# Patient Record
Sex: Male | Born: 1965 | Race: White | Hispanic: No | Marital: Married | Smoking: Former smoker
Health system: Southern US, Community
[De-identification: ages and names within clinical notes are randomized; demographics above are authoritative.]

## PROBLEM LIST (undated history)

## (undated) ENCOUNTER — Emergency Department (HOSPITAL_BASED_OUTPATIENT_CLINIC_OR_DEPARTMENT_OTHER): Admission: EM | Payer: Self-pay | Source: Home / Self Care

## (undated) DIAGNOSIS — K760 Fatty (change of) liver, not elsewhere classified: Secondary | ICD-10-CM

## (undated) DIAGNOSIS — K859 Acute pancreatitis without necrosis or infection, unspecified: Secondary | ICD-10-CM

## (undated) DIAGNOSIS — I10 Essential (primary) hypertension: Secondary | ICD-10-CM

---

## 2010-11-06 ENCOUNTER — Emergency Department (HOSPITAL_BASED_OUTPATIENT_CLINIC_OR_DEPARTMENT_OTHER): Payer: 59

## 2010-11-06 ENCOUNTER — Emergency Department (HOSPITAL_BASED_OUTPATIENT_CLINIC_OR_DEPARTMENT_OTHER)
Admission: EM | Admit: 2010-11-06 | Discharge: 2010-11-06 | Disposition: A | Discharge status: Nursing Home (Includes ICF) | Payer: 59 | Attending: Emergency Medicine | Admitting: Emergency Medicine

## 2010-11-06 ENCOUNTER — Inpatient Hospital Stay (HOSPITAL_COMMUNITY)
Admission: RE | Admit: 2010-11-06 | Discharge: 2010-11-10 | DRG: 440 | Disposition: A | Discharge status: Home / Self Care | Payer: 59 | Source: Other Acute Inpatient Hospital | Attending: Family Medicine | Admitting: Family Medicine

## 2010-11-06 ENCOUNTER — Emergency Department (INDEPENDENT_AMBULATORY_CARE_PROVIDER_SITE_OTHER): Payer: 59

## 2010-11-06 DIAGNOSIS — I10 Essential (primary) hypertension: Secondary | ICD-10-CM | POA: Diagnosis present

## 2010-11-06 DIAGNOSIS — K859 Acute pancreatitis without necrosis or infection, unspecified: Principal | ICD-10-CM | POA: Diagnosis present

## 2010-11-06 DIAGNOSIS — K7689 Other specified diseases of liver: Secondary | ICD-10-CM | POA: Diagnosis present

## 2010-11-06 DIAGNOSIS — I959 Hypotension, unspecified: Secondary | ICD-10-CM | POA: Diagnosis present

## 2010-11-06 DIAGNOSIS — R7989 Other specified abnormal findings of blood chemistry: Secondary | ICD-10-CM | POA: Diagnosis present

## 2010-11-06 DIAGNOSIS — Z23 Encounter for immunization: Secondary | ICD-10-CM

## 2010-11-06 DIAGNOSIS — R109 Unspecified abdominal pain: Secondary | ICD-10-CM

## 2010-11-06 DIAGNOSIS — D72829 Elevated white blood cell count, unspecified: Secondary | ICD-10-CM | POA: Diagnosis present

## 2010-11-06 DIAGNOSIS — K8689 Other specified diseases of pancreas: Secondary | ICD-10-CM

## 2010-11-06 DIAGNOSIS — F1010 Alcohol abuse, uncomplicated: Secondary | ICD-10-CM | POA: Diagnosis present

## 2010-11-06 DIAGNOSIS — R079 Chest pain, unspecified: Secondary | ICD-10-CM | POA: Diagnosis present

## 2010-11-06 DIAGNOSIS — W010XXA Fall on same level from slipping, tripping and stumbling without subsequent striking against object, initial encounter: Secondary | ICD-10-CM | POA: Diagnosis present

## 2010-11-06 LAB — URINALYSIS, ROUTINE W REFLEX MICROSCOPIC
Glucose, UA: NEGATIVE mg/dL
Ketones, ur: 40 mg/dL — AB
Nitrite: NEGATIVE
Specific Gravity, Urine: 1.019 (ref 1.005–1.030)
pH: 7 (ref 5.0–8.0)

## 2010-11-06 LAB — CBC
MCHC: 34.6 g/dL (ref 30.0–36.0)
MCV: 87.4 fL (ref 78.0–100.0)
Platelets: 226 10*3/uL (ref 150–400)
RDW: 12.9 % (ref 11.5–15.5)
WBC: 14.7 10*3/uL — ABNORMAL HIGH (ref 4.0–10.5)

## 2010-11-06 LAB — COMPREHENSIVE METABOLIC PANEL
Albumin: 4.8 g/dL (ref 3.5–5.2)
Alkaline Phosphatase: 80 U/L (ref 39–117)
BUN: 11 mg/dL (ref 6–23)
Calcium: 9.4 mg/dL (ref 8.4–10.5)
Potassium: 4.7 mEq/L (ref 3.5–5.1)
Total Protein: 8.5 g/dL — ABNORMAL HIGH (ref 6.0–8.3)

## 2010-11-06 LAB — DIFFERENTIAL
Basophils Absolute: 0 10*3/uL (ref 0.0–0.1)
Eosinophils Absolute: 0.2 10*3/uL (ref 0.0–0.7)
Eosinophils Relative: 1 % (ref 0–5)
Lymphs Abs: 1.8 10*3/uL (ref 0.7–4.0)
Monocytes Absolute: 1.7 10*3/uL — ABNORMAL HIGH (ref 0.1–1.0)

## 2010-11-06 MED ORDER — IOHEXOL 300 MG/ML  SOLN
100.0000 mL | Freq: Once | INTRAMUSCULAR | Status: AC | PRN
  Administered 2010-11-06: 100 mL via INTRAVENOUS

## 2010-11-07 ENCOUNTER — Inpatient Hospital Stay (HOSPITAL_COMMUNITY): Payer: 59

## 2010-11-07 LAB — CBC
MCHC: 33.3 g/dL (ref 30.0–36.0)
Platelets: 209 10*3/uL (ref 150–400)
RDW: 12.9 % (ref 11.5–15.5)
WBC: 14.5 10*3/uL — ABNORMAL HIGH (ref 4.0–10.5)

## 2010-11-07 LAB — HEPATIC FUNCTION PANEL
ALT: 47 U/L (ref 0–53)
Albumin: 3.2 g/dL — ABNORMAL LOW (ref 3.5–5.2)
Alkaline Phosphatase: 54 U/L (ref 39–117)
Indirect Bilirubin: 1.3 mg/dL — ABNORMAL HIGH (ref 0.3–0.9)
Total Bilirubin: 1.7 mg/dL — ABNORMAL HIGH (ref 0.3–1.2)
Total Protein: 6.6 g/dL (ref 6.0–8.3)

## 2010-11-07 LAB — CARDIAC PANEL(CRET KIN+CKTOT+MB+TROPI)
Relative Index: INVALID (ref 0.0–2.5)
Troponin I: 0.01 ng/mL (ref 0.00–0.06)

## 2010-11-07 LAB — LIPID PANEL
HDL: 39 mg/dL — ABNORMAL LOW (ref >39)
Triglycerides: 63 mg/dL (ref <150)
VLDL: 13 mg/dL (ref 0–40)

## 2010-11-07 LAB — GLUCOSE, CAPILLARY
Glucose-Capillary: 104 mg/dL — ABNORMAL HIGH (ref 70–99)
Glucose-Capillary: 80 mg/dL (ref 70–99)
Glucose-Capillary: 92 mg/dL (ref 70–99)
Glucose-Capillary: 94 mg/dL (ref 70–99)

## 2010-11-07 LAB — BASIC METABOLIC PANEL
BUN: 10 mg/dL (ref 6–23)
Calcium: 8.7 mg/dL (ref 8.4–10.5)
Creatinine, Ser: 1.01 mg/dL (ref 0.4–1.5)
GFR calc Af Amer: >60 mL/min (ref >60)

## 2010-11-07 LAB — MAGNESIUM: Magnesium: 1.9 mg/dL (ref 1.5–2.5)

## 2010-11-08 ENCOUNTER — Inpatient Hospital Stay (HOSPITAL_COMMUNITY): Payer: 59

## 2010-11-08 LAB — URINE MICROSCOPIC-ADD ON

## 2010-11-08 LAB — COMPREHENSIVE METABOLIC PANEL
AST: 20 U/L (ref 0–37)
BUN: 9 mg/dL (ref 6–23)
CO2: 23 mEq/L (ref 19–32)
Calcium: 8.6 mg/dL (ref 8.4–10.5)
Chloride: 105 mEq/L (ref 96–112)
Creatinine, Ser: 0.84 mg/dL (ref 0.4–1.5)
GFR calc Af Amer: >60 mL/min (ref >60)
GFR calc non Af Amer: >60 mL/min (ref >60)
Glucose, Bld: 93 mg/dL (ref 70–99)
Total Bilirubin: 1.7 mg/dL — ABNORMAL HIGH (ref 0.3–1.2)

## 2010-11-08 LAB — CBC
Hemoglobin: 14.4 g/dL (ref 13.0–17.0)
MCH: 31.2 pg (ref 26.0–34.0)
MCHC: 34.8 g/dL (ref 30.0–36.0)
MCV: 89.8 fL (ref 78.0–100.0)
RBC: 4.61 MIL/uL (ref 4.22–5.81)

## 2010-11-08 LAB — URINALYSIS, ROUTINE W REFLEX MICROSCOPIC
Leukocytes, UA: NEGATIVE
Nitrite: NEGATIVE
Specific Gravity, Urine: 1.022 (ref 1.005–1.030)
Urobilinogen, UA: 0.2 mg/dL (ref 0.0–1.0)
pH: 5.5 (ref 5.0–8.0)

## 2010-11-08 LAB — GLUCOSE, CAPILLARY: Glucose-Capillary: 86 mg/dL (ref 70–99)

## 2010-11-09 LAB — DIFFERENTIAL
Basophils Absolute: 0.1 10*3/uL (ref 0.0–0.1)
Basophils Relative: 0 % (ref 0–1)
Eosinophils Relative: 3 % (ref 0–5)
Monocytes Absolute: 1.9 10*3/uL — ABNORMAL HIGH (ref 0.1–1.0)
Monocytes Relative: 14 % — ABNORMAL HIGH (ref 3–12)
Neutro Abs: 9.8 10*3/uL — ABNORMAL HIGH (ref 1.7–7.7)

## 2010-11-09 LAB — CBC
HCT: 37.8 % — ABNORMAL LOW (ref 39.0–52.0)
Hemoglobin: 13 g/dL (ref 13.0–17.0)
MCH: 30.6 pg (ref 26.0–34.0)
MCHC: 34.4 g/dL (ref 30.0–36.0)
RDW: 12.6 % (ref 11.5–15.5)

## 2010-11-10 LAB — COMPREHENSIVE METABOLIC PANEL
AST: 30 U/L (ref 0–37)
Albumin: 2.9 g/dL — ABNORMAL LOW (ref 3.5–5.2)
BUN: 11 mg/dL (ref 6–23)
Creatinine, Ser: 0.82 mg/dL (ref 0.4–1.5)
GFR calc Af Amer: >60 mL/min (ref >60)
Total Protein: 6.6 g/dL (ref 6.0–8.3)

## 2010-11-10 LAB — CBC
MCH: 30.2 pg (ref 26.0–34.0)
MCV: 87.6 fL (ref 78.0–100.0)
Platelets: 283 10*3/uL (ref 150–400)
RBC: 4.44 MIL/uL (ref 4.22–5.81)
RDW: 12.6 % (ref 11.5–15.5)
WBC: 12.3 10*3/uL — ABNORMAL HIGH (ref 4.0–10.5)

## 2010-11-11 NOTE — H&P (Signed)
NAME:  [name], [name] NO.:  [id number]  MEDICAL RECORD NO.:  [id number]           PATIENT TYPE:  I  LOCATION:  5128                         FACILITY:  [facility]  PHYSICIAN:  Eduard Clos, MDDATE OF BIRTH:  08-Mar-1966  DATE OF ADMISSION:  11/06/2010 DATE OF DISCHARGE:                             HISTORY & PHYSICAL   PRIMARY CARE PHYSICIAN:  [facility].  CHIEF COMPLAINT:  Abdominal pain and left-sided chest pain.  HISTORY OF PRESENT ILLNESS:  A 45 year old male with not significant past medical history had a fall 2 days ago when he tripped and fell, hit his chest.  He did not lose consciousness.  Since then, his chest has been hurting.  He took [name] of Aleve 2-4 tablets a day and yesterday when he was eating lunch, he saw developing abdominal pain which was diffusely across his upper abdomen, not radiating to back.  It is like stabbing pain.  Initially, it was constant, it became more episodic and lasted for few seconds.  Denies any nausea or vomiting.  Denies any diarrhea.  Denies any fever or chills.  Denies any cough or phlegm or shortness of breath.  The patient's left-sided chest pain is more on the rib and on palpation, one of the rib is tender.  The patient denies any loss of consciousness, denies any dizziness or any focal deficit. Initially, he had some mild headache which has resolved at this time.  PAST MEDICAL HISTORY:  Nothing significant.  PAST SURGICAL HISTORY:  None.  MEDICATIONS ON ADMISSION:  He was taking Aleve p.r.n., Dexilant and tramadol.  ALLERGIES:  No known drug allergies.  FAMILY HISTORY:  Negative for any diabetes, stroke, heart disease, cancer as per patient.  SOCIAL HISTORY:  The patient is married.  He lives with his wife. Denies smoking cigarettes.  Drinks a cup of wine or a beer 2-3 times a week.  The last drink was 3 days ago.  He states he can stay away from alcohol for many weeks.  REVIEW OF  SYSTEMS:  As per history of present illness and nothing else significant.  PHYSICAL EXAMINATION:  GENERAL:  The patient examined at bedside not in acute distress. VITAL SIGNS:  Blood pressure is 130/90, pulses are 110 per minute, temperature 98.8, respiration is 18, O2 sat is 99%. HEENT:  Anicteric.  No pallor.  No discharge from ears, eyes, nose or mouth. CHEST:  Bilateral air entry present.  There is mild tenderness in the left axillary border one of the ribs.  I do not see any discoloration nor any swelling. HEART:  S1, S2 heard. ABDOMEN:  Soft, nontender.  Bowel sounds heard.  No guarding or rigidity.  No discoloration. CNS:  Alert, awake, and oriented to time, place and person.  Moves upper and lower extremities 5/5. EXTREMITIES:  Peripheral pulses present.  No edema.  LABORATORY DATA:  EKG shows normal sinus rhythm with a sinus tachycardia, poor-wave progression, heart rate is around 108 beats per minute, with nonspecific ST changes.  CBC WBC is 14.7, hemoglobin is 15.8, hematocrit is 45.6, platelets 226.  Complete metabolic panel sodium  141, potassium 4.7, chloride 103, carbon dioxide 27, glucose 102, BUN 11, creatinine 1.1, total bilirubin 1.6, alkaline phosphatase is 80, AST 34, ALT 76.  Total protein 8.5, albumin 4.8, calcium 9.4, lipase 538.  UA is negative for nitrites, leukocytes, glucose negative, ketones 40.  CT abdomen and pelvis shows hepatic edema and/or inflammation associated with the head and body of the pancreas making features suggestive of pancreatitis.  No evidence of pseudocyst.  No vascular complication.  ASSESSMENT: 1. Acute pancreatitis. 2. Left-sided rib pain. 3. Leukocytosis.  PLAN:  At this time: 1. We are going to admit the patient to medical floor. 2. For his acute pancreatitis, we will keep the patient n.p.o. and     aggressively hydrate the patient.  We are going to check a fasting     lipid panel particularly looking for triglycerides.  At  this time,     I do not know exact cause for his hepatitis.  The patient does     drink a beer couple of times a week or he drinks wine couple of     times a week.  Do not know if this could cause pancreatitis.  At     this time, the patient's CAT scan does not show any gallbladder     pathology.  If in case his LFTs worsens in the morning, we may have     to further check for CBD dilatation or obstruction by MRCP.  At     this time, the patient to be kept on pain relief medications. 3. Left-sided chest pain is tender to touch in one of the ribs.  We     will get an x-ray of the rib to see.  At the same time, we will     also get chest x-rays PA and lateral making sure there is no     significant pleural effusion. 4. Leukocytosis.  At this time, I do not see a different cause for any     infection, anyway we will be rechecking chest x-ray.  UA does not     show any UTI features.  So at this moment, we are going to keep him     off any antibiotics.  The leukocytosis could be from the reaction     to acute hepatitis. 5. The patient does drink beer or wine a couple of times a week.  So     at this time, the patient will be on p.r.n. Ativan. 6. Further recommendation as condition evolves.     Eduard Clos, MD     ANK/MEDQ  D:  11/07/2010  T:  11/07/2010  Job:  [id number]  cc:   Deboraha Sprang at [facility]  Electronically Signed by Midge Minium MD on 11/11/2010 08:27:01 AM

## 2010-11-13 NOTE — Discharge Summary (Signed)
[name], [name]               ACCOUNT NO.:  [id number]  MEDICAL RECORD NO.:  [id number]           PATIENT TYPE:  I  LOCATION:  5128                         FACILITY:  [facility]  PHYSICIAN:  Mauro Kaufmann, MD         DATE OF BIRTH:  [birth year]  DATE OF ADMISSION:  11/06/2010 DATE OF DISCHARGE:  11/10/2010                              DISCHARGE SUMMARY   ADMISSION DIAGNOSES: 1. Acute pancreatitis. 2. Left-sided rib pain. 3. Leukocytosis.  DISCHARGE DIAGNOSES: 1. Acute pancreatitis, resolved. 2. Left-sided rib pain, resolved. 3. Leukocytosis, resolved. 4. Hypertension.  TESTS PERFORMED IN THE HOSPITAL STAY:  Include CT scan of the abdomen and pelvis on November 06, 2010, showed subtle edema and the inflammation in the head of pancreas.  Imaging suggestive of acute pancreatitis.  No evidence of pseudocyst.  No risk of complication.  Chest x-ray on November 07, 2010, showed no acute aneurysm, focal cardiopulmonary abnormality noted.  No definite rib abnormality seen.  Rib x-ray on November 07, 2010, showed normal examination.  Abdominal ultrasound on November 07, 2010, showed fatty infiltration of the liver, midline stricture not well visualized due to the overlying bowel gas.  Chest x-ray on November 08, 2010, showed stable cardiopulmonary appearance with no focal or acute abnormality distress.  PERTINENT LABS:  The patient had lipase of 538 on the day of admission on November 06, 2010, and lipase improved to 30 as of November 08, 2010.  The patient also had elevated liver enzymes with AST 34, ALT 76, and total bili was 1.6.  As of November 10, 2010, the liver enzymes are back to normal AST is 30, ALT less than 8, total bili is 0.8.  Urinalysis was negative. The patient's lipid profile showed cholesterol 113 and total cholesterol of 165, which is in a normal range.  Hepatic function panel on November 07, 2010, showed direct bilirubin 0.4, total of 1.7, and indirect bilirubin was 1.3.  BRIEF HISTORY AND  PHYSICAL:  This is a 45 year old male with no significant past medical history who fell 2 days ago and tripped and hit on the left side of the chest.  He did not lose consciousness.  He has been taking Aleve for 2-4 days, and today when he was eating lunch he developed some abdominal pain, diffusely grossly to abdomen, and not radiating to the back.  The patient was admitted with diagnosis of acute pancreatitis as well as found to have elevated lipase.  He also had elevated LFTs on the day of admission.  BRIEF HOSPITAL COURSE: 1. Acute pancreatitis.  The patient was treated conservatively with     IV fluids and was kept NPO.  His lipase improved and came down from     538-30.  The patient did admit that he drinks about 3 or 4 times a     week, but he does not think that this is a problem.  All the other     workup included the CAT scan, the abdominal ultrasound did not show     any gallstones and there he did not have high triglycerides to  suggest the pancreatitis as because of the elevated triglycerides.     So, at this time it looks the patient's pancreatitis is due to the     alcohol use.  The patient has been explained and advised to not to     drink alcohol. 2. Left-sided chest pain.  The patient has had left-sided chest pain     and was tender to touch in the left side of the chest wall.  The     rib series were negative, did not show any rib fracture, so at this     time he needs pain control. 3. Hypotension.  The patient was found to have hypotension in the     hospital, which was thought to be initially true alcohol withdrawal     and the patient was put on Ativan CIWA protocol for alcohol     withdrawal.  At this time, the patient has been started on     metoprolol 25 b.i.d. and blood pressure is well controlled on the     day of discharge, BP is 133/90.  The patient will continue to take     his medications.  He will follow up with his primary care     physician.  Also,  I am going to give the patient thiamine, folic     acid for the alcohol use.  MEDICATION ON DISCHARGE:  Include 1. Folic acid 1 mg p.o. daily. 2. Hydrocodone and acetaminophen, 5/325 one tablet every 8 hours as     needed for pain. 3. Metoprolol 25 p.o. b.i.d. 4. Thiamine 100 mg p.o. daily.  The patient is advised to follow a low-fat diet.     Mauro Kaufmann, MD     GL/MEDQ  D:  11/10/2010  T:  11/11/2010  Job:  [id number]  cc:   Deboraha Sprang at [facility]  Electronically Signed by Mauro Kaufmann  on 11/13/2010 02:47:54 PM

## 2010-11-13 NOTE — Discharge Summary (Signed)
  [name], [name]               ACCOUNT NO.:  [id number]  MEDICAL RECORD NO.:  [id number]           PATIENT TYPE:  I  LOCATION:  5128                         FACILITY:  [facility]  PHYSICIAN:  Mauro Kaufmann, MD         DATE OF BIRTH:  04/26/1966  DATE OF ADMISSION:  11/06/2010 DATE OF DISCHARGE:  11/10/2010                              DISCHARGE SUMMARY   ADDENDUM.  The patient also had leukocytosis , on day of discharge which actually resolved.  His white count was as high as 17,000 on November 08, 2010, and has come down to 12.3.  All the workup for the infection has been negative.  The patient only had low-grade temperature and he has been afebrile for past 1 day, so at this time this leukocytosis as well as mild low-grade temperature is attributed to acute pancreatitis, which is resolving.  The patient should go back to the primary care physician if he continues to have abdominal pain which does not resolve in next 2-3 days.  At this time, the abdominal pain is attributed to the acute pancreatitis and the patient is requiring intermittent medications for that.  The patient will be sent home on Lortab p.r.n. for the pain control.  As of today, the patient's white count is 12.3, and vitals on the day of discharge temperature 98.7, pulse 80, respiration is 18, blood pressure 133/90, O2 sat 98% on room air.     Mauro Kaufmann, MD     GL/MEDQ  D:  11/10/2010  T:  11/10/2010  Job:  [id number]  Electronically Signed by Mauro Kaufmann  on 11/13/2010 02:47:28 PM

## 2012-01-19 ENCOUNTER — Encounter (HOSPITAL_BASED_OUTPATIENT_CLINIC_OR_DEPARTMENT_OTHER): Payer: Self-pay | Admitting: Emergency Medicine

## 2012-01-19 ENCOUNTER — Emergency Department (HOSPITAL_BASED_OUTPATIENT_CLINIC_OR_DEPARTMENT_OTHER): Payer: 59

## 2012-01-19 ENCOUNTER — Emergency Department (HOSPITAL_BASED_OUTPATIENT_CLINIC_OR_DEPARTMENT_OTHER)
Admission: EM | Admit: 2012-01-19 | Discharge: 2012-01-19 | Disposition: A | Discharge status: Home / Self Care | Payer: 59 | Attending: Emergency Medicine | Admitting: Emergency Medicine

## 2012-01-19 DIAGNOSIS — K7689 Other specified diseases of liver: Secondary | ICD-10-CM | POA: Insufficient documentation

## 2012-01-19 DIAGNOSIS — Q619 Cystic kidney disease, unspecified: Secondary | ICD-10-CM | POA: Insufficient documentation

## 2012-01-19 DIAGNOSIS — R109 Unspecified abdominal pain: Secondary | ICD-10-CM

## 2012-01-19 DIAGNOSIS — I10 Essential (primary) hypertension: Secondary | ICD-10-CM | POA: Insufficient documentation

## 2012-01-19 DIAGNOSIS — R1033 Periumbilical pain: Secondary | ICD-10-CM | POA: Insufficient documentation

## 2012-01-19 HISTORY — DX: Essential (primary) hypertension: I10

## 2012-01-19 HISTORY — DX: Fatty (change of) liver, not elsewhere classified: K76.0

## 2012-01-19 HISTORY — DX: Acute pancreatitis without necrosis or infection, unspecified: K85.90

## 2012-01-19 LAB — COMPREHENSIVE METABOLIC PANEL
Alkaline Phosphatase: 70 U/L (ref 39–117)
BUN: 12 mg/dL (ref 6–23)
CO2: 25 mEq/L (ref 19–32)
Chloride: 104 mEq/L (ref 96–112)
Creatinine, Ser: 1 mg/dL (ref 0.50–1.35)
GFR calc Af Amer: >90 mL/min (ref >90)
GFR calc non Af Amer: 89 mL/min — ABNORMAL LOW (ref >90)
Glucose, Bld: 148 mg/dL — ABNORMAL HIGH (ref 70–99)
Potassium: 4.2 mEq/L (ref 3.5–5.1)
Total Bilirubin: 0.3 mg/dL (ref 0.3–1.2)

## 2012-01-19 LAB — LIPASE, BLOOD: Lipase: 26 U/L (ref 11–59)

## 2012-01-19 LAB — DIFFERENTIAL
Basophils Absolute: 0.1 10*3/uL (ref 0.0–0.1)
Basophils Relative: 1 % (ref 0–1)
Monocytes Absolute: 0.5 10*3/uL (ref 0.1–1.0)
Neutro Abs: 9.9 10*3/uL — ABNORMAL HIGH (ref 1.7–7.7)
Neutrophils Relative %: 81 % — ABNORMAL HIGH (ref 43–77)

## 2012-01-19 LAB — CBC
HCT: 44.4 % (ref 39.0–52.0)
MCHC: 35.4 g/dL (ref 30.0–36.0)
Platelets: 282 10*3/uL (ref 150–400)
RDW: 12.8 % (ref 11.5–15.5)

## 2012-01-19 MED ORDER — ONDANSETRON HCL 4 MG/2ML IJ SOLN
4.0000 mg | Freq: Once | INTRAMUSCULAR | Status: AC
  Administered 2012-01-19: 4 mg via INTRAVENOUS
  Filled 2012-01-19: qty 2

## 2012-01-19 MED ORDER — HYDROMORPHONE HCL PF 1 MG/ML IJ SOLN
1.0000 mg | Freq: Once | INTRAMUSCULAR | Status: AC
  Administered 2012-01-19: 1 mg via INTRAVENOUS
  Filled 2012-01-19: qty 1

## 2012-01-19 MED ORDER — IOHEXOL 300 MG/ML  SOLN: 100.0000 mL | Freq: Once | INTRAMUSCULAR | Status: DC | PRN

## 2012-01-19 MED ORDER — IOHEXOL 300 MG/ML  SOLN: 40.0000 mL | INTRAMUSCULAR | Status: DC

## 2012-01-19 MED ORDER — SODIUM CHLORIDE 0.9 % IV SOLN
INTRAVENOUS | Status: DC
  Administered 2012-01-19: 03:00:00 via INTRAVENOUS

## 2012-01-19 NOTE — ED Notes (Signed)
States had abdominal pain with nausea without vomiting.

## 2012-01-19 NOTE — ED Provider Notes (Signed)
History     CSN: [id number]  Arrival date & time 01/19/12  0244   First MD Initiated Contact with Patient 01/19/12 0321      Chief Complaint  Patient presents with  . Abdominal Pain    (Consider location/radiation/quality/duration/timing/severity/associated sxs/prior treatment) HPI This is a 46 year old white male with a history of pancreatitis. He is here with abdominal pain that he began yesterday evening about 8 PM. It is moderate to severe and located in the paraumbilical region radiating bilaterally like a band across his abdomen. He is not sure if it is like the pain of his pancreatitis. The pain is worse with palpation. He had a single episode of emesis earlier which did not relieve the pain. He took Maalox without relief. He has had no fever or diarrhea.  Past Medical History  Diagnosis Date  . Hypertension   . Pancreatitis   . Fatty liver     History reviewed. No pertinent past surgical history.  No family history on file.  History  Substance Use Topics  . Smoking status: Former Games developer  . Smokeless tobacco: Not on file  . Alcohol Use: No      Review of Systems  All other systems reviewed and are negative.    Allergies  Review of patient's allergies indicates no known allergies.  Home Medications  No current outpatient prescriptions on file.  BP 173/112  Pulse 77  Temp(Src) 98.5 F (36.9 C) (Oral)  Resp 16  Ht 5\' 5"  (1.651 m)  Wt 210 lb (95.255 kg)  BMI 34.95 kg/m2  SpO2 99%  Physical Exam General: Well-developed, well-nourished male in no acute distress; appearance consistent with age of record HENT: normocephalic, atraumatic Eyes: pupils equal round and reactive to light; extraocular muscles intact Neck: supple Heart: regular rate and rhythm Lungs: clear to auscultation bilaterally Abdomen: soft; nondistended; moderate periumbilical tenderness with lesser tenderness in the epigastrium and the upper quadrants; no masses or hepatosplenomegaly;  bowel sounds present Extremities: No deformity; full range of motion Neurologic: Awake, alert and oriented; motor function intact in all extremities and symmetric; no facial droop Skin: Warm and dry Psychiatric: Normal mood and affect    ED Course  Procedures (including critical care time)     MDM   Nursing notes and vitals signs, including pulse oximetry, reviewed.  Summary of this visit's results, reviewed by myself:  Labs:  Results for orders placed during the hospital encounter of 01/19/12  COMPREHENSIVE METABOLIC PANEL      Component Value Range   Sodium 139  135 - 145 (mEq/L)   Potassium 4.2  3.5 - 5.1 (mEq/L)   Chloride 104  96 - 112 (mEq/L)   CO2 25  19 - 32 (mEq/L)   Glucose, Bld 148 (*) 70 - 99 (mg/dL)   BUN 12  6 - 23 (mg/dL)   Creatinine, Ser 4.03  0.50 - 1.35 (mg/dL)   Calcium 9.7  8.4 - 47.4 (mg/dL)   Total Protein 7.6  6.0 - 8.3 (g/dL)   Albumin 4.2  3.5 - 5.2 (g/dL)   AST 32  0 - 37 (U/L)   ALT 67 (*) 0 - 53 (U/L)   Alkaline Phosphatase 70  39 - 117 (U/L)   Total Bilirubin 0.3  0.3 - 1.2 (mg/dL)   GFR calc non Af Amer 89 (*) >90 (mL/min)   GFR calc Af Amer >90  >90 (mL/min)  LIPASE, BLOOD      Component Value Range   Lipase 26  11 -  59 (U/L)  CBC      Component Value Range   WBC 12.2 (*) 4.0 - 10.5 (K/uL)   RBC 5.08  4.22 - 5.81 (MIL/uL)   Hemoglobin 15.7  13.0 - 17.0 (g/dL)   HCT 01.6  01.0 - 93.2 (%)   MCV 87.4  78.0 - 100.0 (fL)   MCH 30.9  26.0 - 34.0 (pg)   MCHC 35.4  30.0 - 36.0 (g/dL)   RDW 35.5  73.2 - 20.2 (%)   Platelets 282  150 - 400 (K/uL)  DIFFERENTIAL      Component Value Range   Neutrophils Relative 81 (*) 43 - 77 (%)   Neutro Abs 9.9 (*) 1.7 - 7.7 (K/uL)   Lymphocytes Relative 13  12 - 46 (%)   Lymphs Abs 1.6  0.7 - 4.0 (K/uL)   Monocytes Relative 4  3 - 12 (%)   Monocytes Absolute 0.5  0.1 - 1.0 (K/uL)   Eosinophils Relative 1  0 - 5 (%)   Eosinophils Absolute 0.1  0.0 - 0.7 (K/uL)   Basophils Relative 1  0 - 1 (%)    Basophils Absolute 0.1  0.0 - 0.1 (K/uL)    Imaging Studies: Ct Abdomen Pelvis W Contrast  01/19/2012  *RADIOLOGY REPORT*  Clinical Data: Abdominal pain  CT ABDOMEN AND PELVIS WITH CONTRAST  Technique:  Multidetector CT imaging of the abdomen and pelvis was performed following the standard protocol during bolus administration of intravenous contrast.  Contrast: 1 OMNIPAQUE IOHEXOL 300 MG/ML  SOLN  Comparison: 11/06/2010  Findings: Limited images through the lung bases demonstrate no significant appreciable abnormality. The heart size is within normal limits. No pleural or pericardial effusion.  Low attenuation of the liver is in keeping with hepatic steatosis. Unremarkable biliary system, spleen, pancreas, adrenal glands.  Incompletely characterized interpolar right renal hypodensity. Otherwise symmetric renal enhancement.  No hydronephrosis or hydroureter.  Nonspecific gastric distension.  No bowel obstruction.  No CT evidence for colitis.  The appendix is not confidently identified however no right lower quadrant inflammation.  No free intraperitoneal air or fluid.  No lymphadenopathy.  Normal caliber vasculature.  Scattered atherosclerosis of the right common iliac artery.  Thin-walled bladder.  No acute osseous finding  IMPRESSION: Hepatic steatosis.  1.3 cm interpolar right renal hypodensity is incompletely characterized as it measures higher than water attenuation. Unchanged in size from the comparison CT and unable to visualize on the prior ultrasound.  This is favored to represent a mildly complex cyst (with hemorrhagic or proteinaceous fluid) however would require MRI to definitively characterize.  Discussed via telephone with Dr. Read Drivers at 05:20 a.m. on 01/19/2012.  Original Report Authenticated By: Waneta Martins, M.D.   5:34 AM Abdomen soft with mild epigastric tenderness. Negative Murphy's sign. We will send patient home and have him return should symptoms worsen or change. He was advised of  the CT findings of a renal cyst that couldn't be ruled out as a simple cyst; he will followup with his primary care physicians regarding this.         Hanley Seamen, MD 01/19/12 [phone]

## 2017-02-13 DIAGNOSIS — R972 Elevated prostate specific antigen [PSA]: Secondary | ICD-10-CM | POA: Diagnosis not present

## 2017-02-13 DIAGNOSIS — I10 Essential (primary) hypertension: Secondary | ICD-10-CM | POA: Diagnosis not present

## 2017-02-13 DIAGNOSIS — Z79899 Other long term (current) drug therapy: Secondary | ICD-10-CM | POA: Diagnosis not present

## 2017-02-19 DIAGNOSIS — I10 Essential (primary) hypertension: Secondary | ICD-10-CM | POA: Diagnosis not present

## 2017-02-19 DIAGNOSIS — Z79899 Other long term (current) drug therapy: Secondary | ICD-10-CM | POA: Diagnosis not present

## 2017-02-19 DIAGNOSIS — Z0000 Encounter for general adult medical examination without abnormal findings: Secondary | ICD-10-CM | POA: Diagnosis not present

## 2017-05-18 DIAGNOSIS — Z1211 Encounter for screening for malignant neoplasm of colon: Secondary | ICD-10-CM | POA: Diagnosis not present

## 2017-06-18 DIAGNOSIS — K641 Second degree hemorrhoids: Secondary | ICD-10-CM | POA: Diagnosis not present

## 2017-06-18 DIAGNOSIS — K640 First degree hemorrhoids: Secondary | ICD-10-CM | POA: Diagnosis not present

## 2017-06-18 DIAGNOSIS — Z1211 Encounter for screening for malignant neoplasm of colon: Secondary | ICD-10-CM | POA: Diagnosis not present

## 2017-08-17 DIAGNOSIS — Z79899 Other long term (current) drug therapy: Secondary | ICD-10-CM | POA: Diagnosis not present

## 2017-08-17 DIAGNOSIS — I10 Essential (primary) hypertension: Secondary | ICD-10-CM | POA: Diagnosis not present

## 2017-08-24 DIAGNOSIS — Z79899 Other long term (current) drug therapy: Secondary | ICD-10-CM | POA: Diagnosis not present

## 2017-08-24 DIAGNOSIS — R739 Hyperglycemia, unspecified: Secondary | ICD-10-CM | POA: Diagnosis not present

## 2017-08-24 DIAGNOSIS — I10 Essential (primary) hypertension: Secondary | ICD-10-CM | POA: Diagnosis not present

## 2018-02-17 DIAGNOSIS — Z79899 Other long term (current) drug therapy: Secondary | ICD-10-CM | POA: Diagnosis not present

## 2018-02-17 DIAGNOSIS — I10 Essential (primary) hypertension: Secondary | ICD-10-CM | POA: Diagnosis not present

## 2018-02-17 DIAGNOSIS — Z125 Encounter for screening for malignant neoplasm of prostate: Secondary | ICD-10-CM | POA: Diagnosis not present

## 2018-02-17 DIAGNOSIS — R739 Hyperglycemia, unspecified: Secondary | ICD-10-CM | POA: Diagnosis not present

## 2018-02-23 DIAGNOSIS — R739 Hyperglycemia, unspecified: Secondary | ICD-10-CM | POA: Diagnosis not present

## 2018-02-23 DIAGNOSIS — I10 Essential (primary) hypertension: Secondary | ICD-10-CM | POA: Diagnosis not present

## 2018-02-23 DIAGNOSIS — Z0000 Encounter for general adult medical examination without abnormal findings: Secondary | ICD-10-CM | POA: Diagnosis not present

## 2018-08-26 DIAGNOSIS — R739 Hyperglycemia, unspecified: Secondary | ICD-10-CM | POA: Diagnosis not present

## 2018-08-26 DIAGNOSIS — I10 Essential (primary) hypertension: Secondary | ICD-10-CM | POA: Diagnosis not present

## 2018-08-26 DIAGNOSIS — Z79899 Other long term (current) drug therapy: Secondary | ICD-10-CM | POA: Diagnosis not present

## 2018-08-30 DIAGNOSIS — I10 Essential (primary) hypertension: Secondary | ICD-10-CM | POA: Diagnosis not present

## 2018-08-30 DIAGNOSIS — K760 Fatty (change of) liver, not elsewhere classified: Secondary | ICD-10-CM | POA: Diagnosis not present

## 2018-08-30 DIAGNOSIS — R739 Hyperglycemia, unspecified: Secondary | ICD-10-CM | POA: Diagnosis not present

## 2021-04-08 ENCOUNTER — Emergency Department (HOSPITAL_COMMUNITY): Payer: 59

## 2021-04-08 ENCOUNTER — Encounter (HOSPITAL_COMMUNITY): Payer: Self-pay

## 2021-04-08 ENCOUNTER — Emergency Department (HOSPITAL_COMMUNITY)
Admission: EM | Admit: 2021-04-08 | Discharge: 2021-04-08 | Disposition: A | Discharge status: Home / Self Care | Payer: 59 | Attending: Emergency Medicine | Admitting: Emergency Medicine

## 2021-04-08 ENCOUNTER — Other Ambulatory Visit: Payer: Self-pay

## 2021-04-08 DIAGNOSIS — Z87891 Personal history of nicotine dependence: Secondary | ICD-10-CM | POA: Diagnosis not present

## 2021-04-08 DIAGNOSIS — R630 Anorexia: Secondary | ICD-10-CM | POA: Diagnosis not present

## 2021-04-08 DIAGNOSIS — K859 Acute pancreatitis without necrosis or infection, unspecified: Secondary | ICD-10-CM | POA: Insufficient documentation

## 2021-04-08 DIAGNOSIS — R1011 Right upper quadrant pain: Secondary | ICD-10-CM

## 2021-04-08 DIAGNOSIS — I10 Essential (primary) hypertension: Secondary | ICD-10-CM | POA: Diagnosis not present

## 2021-04-08 DIAGNOSIS — R1013 Epigastric pain: Secondary | ICD-10-CM | POA: Diagnosis present

## 2021-04-08 LAB — URINALYSIS, ROUTINE W REFLEX MICROSCOPIC
Bilirubin Urine: NEGATIVE
Glucose, UA: NEGATIVE mg/dL
Hgb urine dipstick: NEGATIVE
Ketones, ur: 20 mg/dL — AB
Leukocytes,Ua: NEGATIVE
Nitrite: NEGATIVE
Protein, ur: NEGATIVE mg/dL
Specific Gravity, Urine: 1.018 (ref 1.005–1.030)
pH: 5 (ref 5.0–8.0)

## 2021-04-08 LAB — COMPREHENSIVE METABOLIC PANEL
ALT: 44 U/L (ref 0–44)
AST: 24 U/L (ref 15–41)
Albumin: 4.6 g/dL (ref 3.5–5.0)
Alkaline Phosphatase: 56 U/L (ref 38–126)
Anion gap: 15 (ref 5–15)
BUN: 24 mg/dL — ABNORMAL HIGH (ref 6–20)
CO2: 26 mmol/L (ref 22–32)
Calcium: 10.5 mg/dL — ABNORMAL HIGH (ref 8.9–10.3)
Chloride: 99 mmol/L (ref 98–111)
Creatinine, Ser: 1.24 mg/dL (ref 0.61–1.24)
GFR, Estimated: >60 mL/min (ref >60)
Glucose, Bld: 94 mg/dL (ref 70–99)
Potassium: 4.2 mmol/L (ref 3.5–5.1)
Sodium: 140 mmol/L (ref 135–145)
Total Bilirubin: 1.5 mg/dL — ABNORMAL HIGH (ref 0.3–1.2)
Total Protein: 9.3 g/dL — ABNORMAL HIGH (ref 6.5–8.1)

## 2021-04-08 LAB — CBC
HCT: 51 % (ref 39.0–52.0)
Hemoglobin: 16.8 g/dL (ref 13.0–17.0)
MCH: 31 pg (ref 26.0–34.0)
MCHC: 32.9 g/dL (ref 30.0–36.0)
MCV: 94.1 fL (ref 80.0–100.0)
Platelets: 303 10*3/uL (ref 150–400)
RBC: 5.42 MIL/uL (ref 4.22–5.81)
RDW: 13.3 % (ref 11.5–15.5)
WBC: 19 10*3/uL — ABNORMAL HIGH (ref 4.0–10.5)
nRBC: 0 % (ref 0.0–0.2)

## 2021-04-08 LAB — LIPASE, BLOOD: Lipase: 44 U/L (ref 11–51)

## 2021-04-08 MED ORDER — SODIUM CHLORIDE 0.9 % IV BOLUS
500.0000 mL | Freq: Once | INTRAVENOUS | Status: AC
  Administered 2021-04-08: 500 mL via INTRAVENOUS

## 2021-04-08 MED ORDER — OXYCODONE-ACETAMINOPHEN 5-325 MG PO TABS: 1.0000 | ORAL_TABLET | Freq: Four times a day (QID) | ORAL | 0 refills | Status: AC | PRN

## 2021-04-08 MED ORDER — ONDANSETRON HCL 4 MG/2ML IJ SOLN
4.0000 mg | Freq: Once | INTRAMUSCULAR | Status: AC
  Administered 2021-04-08: 4 mg via INTRAVENOUS
  Filled 2021-04-08: qty 2

## 2021-04-08 MED ORDER — FENTANYL CITRATE PF 50 MCG/ML IJ SOSY
50.0000 ug | PREFILLED_SYRINGE | Freq: Once | INTRAMUSCULAR | Status: AC
  Administered 2021-04-08: 50 ug via INTRAVENOUS
  Filled 2021-04-08: qty 1

## 2021-04-08 MED ORDER — IOHEXOL 350 MG/ML SOLN
80.0000 mL | Freq: Once | INTRAVENOUS | Status: AC | PRN
  Administered 2021-04-08: 80 mL via INTRAVENOUS

## 2021-04-08 NOTE — Discharge Instructions (Addendum)
You were seen in the emergency department for some abdominal pain and bloating radiating through to your back.  Your blood work showed your infection cell level to be elevated but otherwise there was no clear explanation for your pain.  Your CAT scan showed some inflammation around the pancreas.  This is likely what is causing your symptoms.  We are prescribing you some pain medication and recommend a clear liquid diet, advance as tolerated.  Follow-up with your primary care doctor.  If you experience worsening pain or any fever please return to the emergency department.  Abstain from alcohol until your symptoms completely resolve.

## 2021-04-08 NOTE — ED Triage Notes (Signed)
Patient c/o mid abdominal pain x 2 days. Patient denies any N/V/D.

## 2021-04-08 NOTE — ED Provider Notes (Signed)
Emergency Medicine Provider Triage Evaluation Note  [name] , a 55 y.o. male  was evaluated in triage.  Pt complains of epigastric abd pain that started 2 days ago after eating a fatty meal.  Review of Systems  Positive: Abd pain Negative: Nv, chilss  Physical Exam  BP 132/87 (BP Location: Right Arm)   Pulse (!) 115   Temp 98.5 F (36.9 C) (Oral)   Resp 16   Ht 5\' 6"  (1.676 m)   Wt 104.3 kg   SpO2 98%   BMI 37.12 kg/m  Gen:   Awake, no distress   Resp:  Normal effort  MSK:   Moves extremities without difficulty  Other:  Luq, epigastric, and ruq ttp  Medical Decision Making  Medically screening exam initiated at 1:18 PM.  Appropriate orders placed.  [name] was informed that the remainder of the evaluation will be completed by another provider, this initial triage assessment does not replace that evaluation, and the importance of remaining in the ED until their evaluation is complete.     Delsa Grana 04/08/21 1318    04/10/21, MD 04/09/21 1524

## 2021-04-08 NOTE — ED Notes (Signed)
Patient is complaining of abdominal pain.

## 2021-04-08 NOTE — ED Provider Notes (Signed)
[facility]-EMERGENCY DEPT Provider Note   CSN: [id number] Arrival date & time: 04/08/21  1132     History Chief Complaint  Patient presents with   Abdominal Pain    [name] is a 55 y.o. male.  He is here with complaint of epigastric abdominal pain rating through to his back that started 2 days ago.  He has had less appetite.  Rates it as 7 out of 10.  Saw his PCP today who did some labs and he had an elevated white count.  About 10 years ago he had a similar pain and was found to have pancreatitis.  Says he has about 3 glasses of wine a week.  No fevers chills nausea vomiting diarrhea constipation or urinary symptoms.  No prior surgical history  The history is provided by the patient.  Abdominal Pain Pain location:  Epigastric Pain quality: aching   Pain radiates to:  Back Pain severity:  Moderate Onset quality:  Gradual Duration:  3 days Timing:  Constant Progression:  Unchanged Chronicity:  Recurrent Relieved by:  None tried Worsened by:  Nothing Ineffective treatments:  None tried Associated symptoms: anorexia   Associated symptoms: no chest pain, no constipation, no cough, no diarrhea, no dysuria, no fever, no nausea, no shortness of breath, no sore throat and no vomiting   Risk factors: no alcohol abuse       Past Medical History:  Diagnosis Date   Fatty liver    Hypertension    Pancreatitis     There are no problems to display for this patient.   History reviewed. No pertinent surgical history.     Family History  Family history unknown: Yes    Social History   Tobacco Use   Smoking status: Former   Smokeless tobacco: Never  Building services engineer Use: Never used  Substance Use Topics   Alcohol use: No   Drug use: Never    Home Medications Prior to Admission medications   Not on File    Allergies    Patient has no known allergies.  Review of Systems   Review of Systems  Constitutional:  Positive for appetite  change. Negative for fever.  HENT:  Negative for sore throat.   Eyes:  Negative for visual disturbance.  Respiratory:  Negative for cough and shortness of breath.   Cardiovascular:  Negative for chest pain.  Gastrointestinal:  Positive for abdominal distention, abdominal pain and anorexia. Negative for constipation, diarrhea, nausea and vomiting.  Genitourinary:  Negative for dysuria.  Musculoskeletal:  Negative for neck pain.  Skin:  Negative for rash.  Neurological:  Negative for headaches.   Physical Exam Updated Vital Signs BP (!) 140/96 (BP Location: Left Arm)   Pulse (!) 114   Temp 97.9 F (36.6 C) (Oral)   Resp 18   Ht 5\' 6"  (1.676 m)   Wt 104.3 kg   SpO2 98%   BMI 37.12 kg/m   Physical Exam Vitals and nursing note reviewed.  Constitutional:      Appearance: Normal appearance. He is well-developed.  HENT:     Head: Normocephalic and atraumatic.  Eyes:     Conjunctiva/sclera: Conjunctivae normal.  Cardiovascular:     Rate and Rhythm: Normal rate and regular rhythm.     Heart sounds: No murmur heard. Pulmonary:     Effort: Pulmonary effort is normal. No respiratory distress.     Breath sounds: Normal breath sounds.  Abdominal:     Palpations:  Abdomen is soft.     Tenderness: There is abdominal tenderness in the right upper quadrant and epigastric area. There is no guarding or rebound.  Musculoskeletal:        General: No deformity or signs of injury. Normal range of motion.     Cervical back: Neck supple.  Skin:    General: Skin is warm and dry.  Neurological:     General: No focal deficit present.     Mental Status: He is alert.    ED Results / Procedures / Treatments   Labs (all labs ordered are listed, but only abnormal results are displayed) Labs Reviewed  COMPREHENSIVE METABOLIC PANEL - Abnormal; Notable for the following components:      Result Value   BUN 24 (*)    Calcium 10.5 (*)    Total Protein 9.3 (*)    Total Bilirubin 1.5 (*)    All  other components within normal limits  CBC - Abnormal; Notable for the following components:   WBC 19.0 (*)    All other components within normal limits  URINALYSIS, ROUTINE W REFLEX MICROSCOPIC - Abnormal; Notable for the following components:   Color, Urine AMBER (*)    APPearance CLOUDY (*)    Ketones, ur 20 (*)    All other components within normal limits  LIPASE, BLOOD    EKG None  Radiology CT Abdomen Pelvis W Contrast  Result Date: 04/08/2021 CLINICAL DATA:  Mid abdominal pain x2 days. EXAM: CT ABDOMEN AND PELVIS WITH CONTRAST TECHNIQUE: Multidetector CT imaging of the abdomen and pelvis was performed using the standard protocol following bolus administration of intravenous contrast. CONTRAST:  46mL OMNIPAQUE IOHEXOL 350 MG/ML SOLN COMPARISON:  January 19, 2012 FINDINGS: Lower chest: No acute abnormality. Hepatobiliary: There is diffuse fatty infiltration of the liver parenchyma. No focal liver abnormality is seen. The gallbladder is markedly contracted in appearance. No gallstones, gallbladder wall thickening, or biliary dilatation. Pancreas: Mild inflammatory fat stranding is seen surrounding the head of the pancreas. There is no evidence of pancreatic ductal dilatation Spleen: Normal in size without focal abnormality. Adrenals/Urinary Tract: Adrenal glands are unremarkable. Kidneys are normal in size, without renal calculi or hydronephrosis. A 1.1 cm diameter cyst is seen within the anterior aspect of the mid right kidney. Bladder is unremarkable. Stomach/Bowel: Stomach is within normal limits. The appendix is not clearly identified. No evidence of bowel wall thickening, distention, or inflammatory changes. Vascular/Lymphatic: No significant vascular findings are present. No enlarged abdominal or pelvic lymph nodes. Reproductive: Prostate is unremarkable. Other: No abdominal wall hernia or abnormality. No abdominopelvic ascites. Musculoskeletal: No acute or significant osseous findings.  IMPRESSION: 1. Mild acute pancreatitis. Correlation with pancreatic enzymes is recommended. 2. Mild inflammation of the adjacent portion of the proximal duodenum can not be excluded. Electronically Signed   By: Aram Candela M.D.   On: 04/08/2021 21:22   US Abdomen Limited RUQ (LIVER/GB)  Result Date: 04/08/2021 CLINICAL DATA:  Right upper quadrant pain EXAM: ULTRASOUND ABDOMEN LIMITED RIGHT UPPER QUADRANT COMPARISON:  CT abdomen/pelvis 01/19/2012, abdominal ultrasound 11/07/2010 FINDINGS: Gallbladder: The gallbladder is not distinctly identified and may be completely contracted. There is no fluid in the expected location of the gallbladder. The gallbladder wall appears mildly thickened, though this is likely accentuated by underdistention. There was no sonographic Murphy's sign reported by the sonographer. Common bile duct: Diameter: 5 mm Liver: The liver parenchyma is significantly increased with decreased through transmission consistent with fatty infiltration. No focal lesions are identified. Portal  vein is patent on color Doppler imaging with normal direction of blood flow towards the liver. Other: None. IMPRESSION: 1. The gallbladder is not well evaluated, possibly completely contracted. No findings to suggest acute cholecystitis. 2. Marked fatty infiltration of the liver. Electronically Signed   By: Lesia Hausen M.D.   On: 04/08/2021 15:36    Procedures Procedures   Medications Ordered in ED Medications  sodium chloride 0.9 % bolus 500 mL (has no administration in time range)  fentaNYL (SUBLIMAZE) injection 50 mcg (has no administration in time range)  ondansetron (ZOFRAN) injection 4 mg (has no administration in time range)    ED Course  I have reviewed the triage vital signs and the nursing notes.  Pertinent labs & imaging results that were available during my care of the patient were reviewed by me and considered in my medical decision making (see chart for details).  Clinical  Course as of 04/09/21 1048  Mon Apr 08, 2021  2140 Reviewed results of imaging and labs with patient.  He does have an elevated white count but he has a fairly benign exam.  He says he feels much better after the pain medicine and fluids.  CT does show some pancreatitis despite his normal lipase.  He is comfortable plan for outpatient management of this.  We will put in a referral to GI for him.  Recommended close follow-up with his PCP.  Return instructions discussed [MB]    Clinical Course User Index [MB] Terrilee Files, MD   MDM Rules/Calculators/A&P                          This patient complains of upper abdominal pain and bloating; this involves an extensive number of treatment Options and is a complaint that carries with it a high risk of complications and Morbidity. The differential includes pancreatitis, cholelithiasis, cholecystitis, peptic ulcer disease  I ordered, reviewed and interpreted labs, which included CBC with elevated white count normal hemoglobin, chemistries normal other than elevated calcium, T bili mildly elevated, urinalysis without signs of infection I ordered medication IV fluids nausea and pain medication with improvement in symptoms I ordered imaging studies which included right upper quadrant ultrasound and CT abdomen pelvis and I independently    visualized and interpreted imaging which showed no signs of cholecystitis, does have inflammatory changes around the pancreas Previous records obtained and reviewed in epic no recent admissions   After the interventions stated above, I reevaluated the patient and found his symptoms to be improved.  We discussed inpatient versus outpatient management of his pancreatitis.  He is comfortable plan for outpatient management we will put a referral in for GI for him.  Return instructions discussed   Final Clinical Impression(s) / ED Diagnoses Final diagnoses:  RUQ abdominal pain  Acute pancreatitis without infection or  necrosis, unspecified pancreatitis type    Rx / DC Orders ED Discharge Orders          Ordered    oxyCODONE-acetaminophen (PERCOCET/ROXICET) 5-325 MG tablet  Every 6 hours PRN        04/08/21 2209             Terrilee Files, MD 04/09/21 1051

## 2023-03-09 IMAGING — CT CT ABD-PELV W/ CM
2 of 5 series · 16 of 46 positions shown, 18 images · IV contrast (omnipaque)
Comparison: January 19, 2012

CLINICAL DATA: Mid abdominal pain x2 days.

EXAM:
CT ABDOMEN AND PELVIS WITH CONTRAST
TECHNIQUE: Multidetector CT imaging of the abdomen and pelvis was performed
using the standard protocol following bolus administration of
intravenous contrast.
CONTRAST:  80mL OMNIPAQUE IOHEXOL 350 MG/ML SOLN

[Series 2: axial st · axial · 0.80mm/px · z∈[+978,+1398]mm · 13 of 97 slices shown, 15 images]
[im 7/97  soft-tissue]
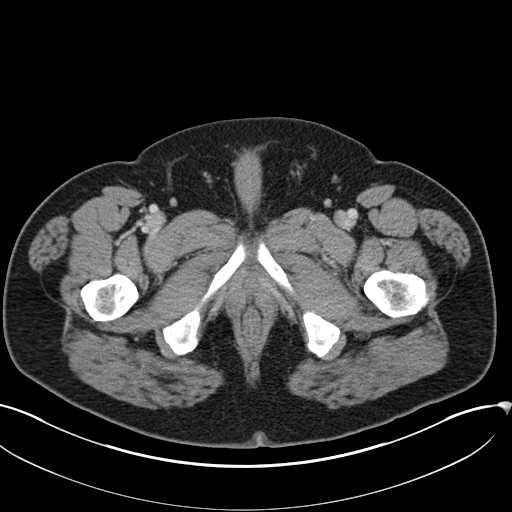
[im 7/97  bone]
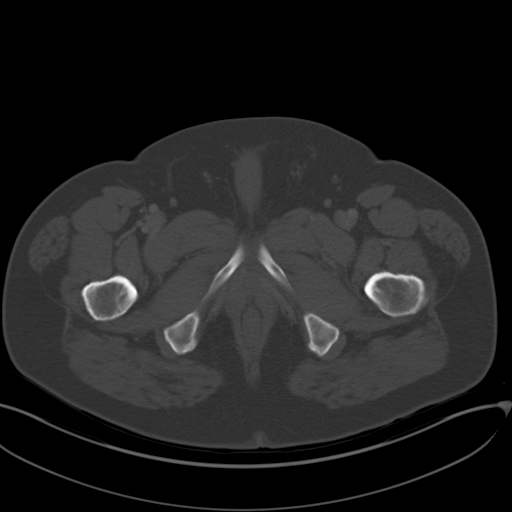
[im 13/97  soft-tissue]
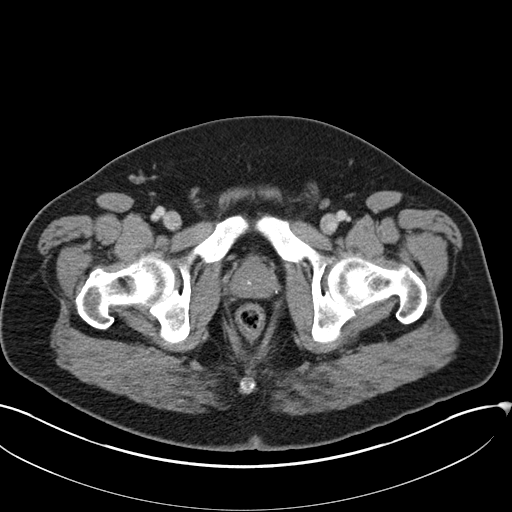
[im 19/97  soft-tissue]
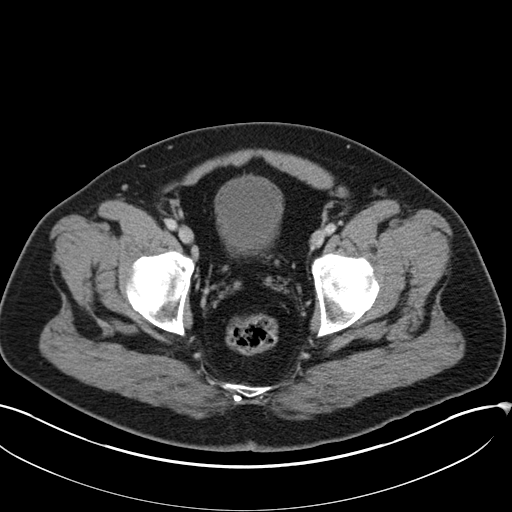
[im 31/97  soft-tissue]
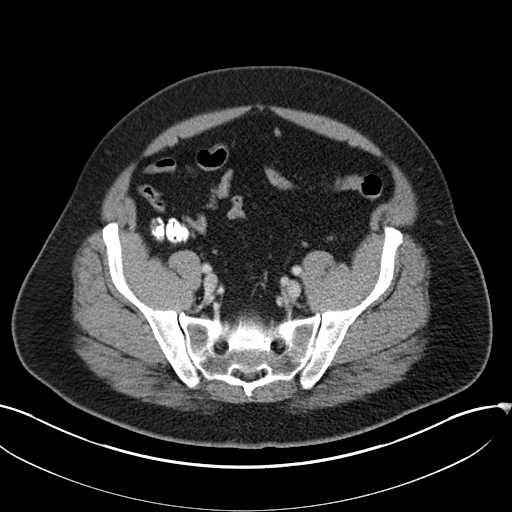
[im 37/97  soft-tissue]
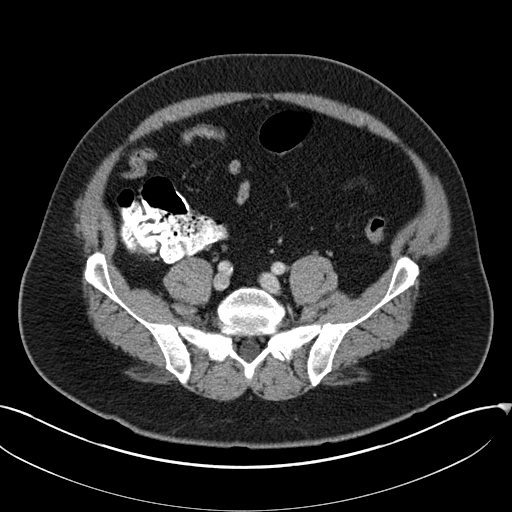
[im 43/97  soft-tissue]
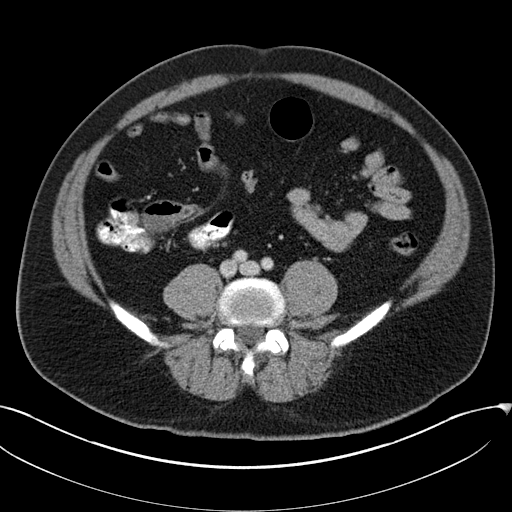
[im 49/97  soft-tissue]
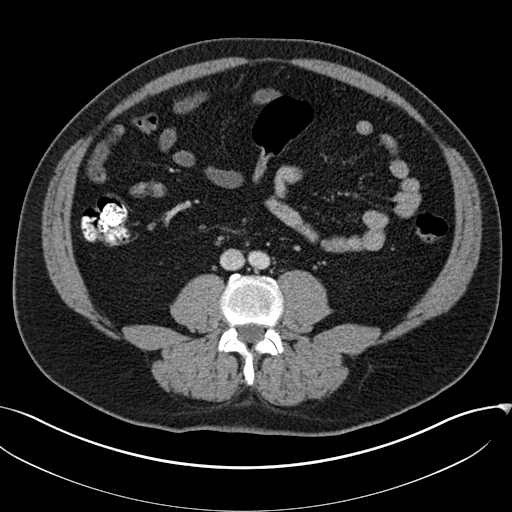
[im 55/97  soft-tissue]
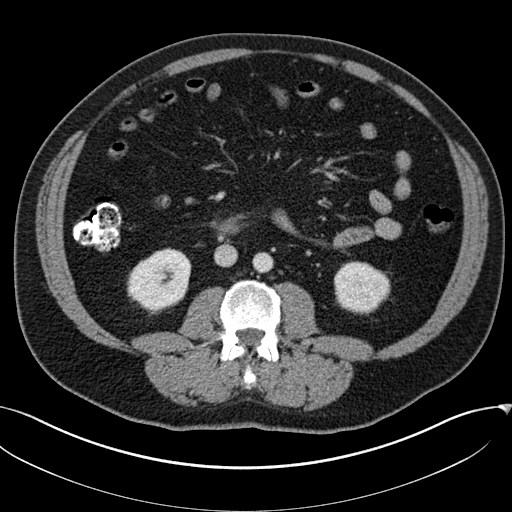
[im 61/97  soft-tissue]
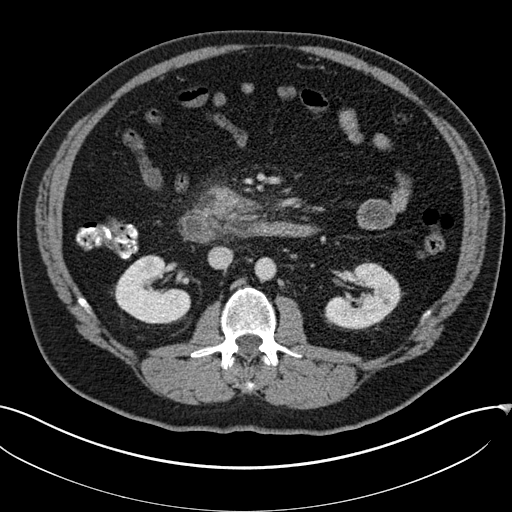
[im 61/97  bone]
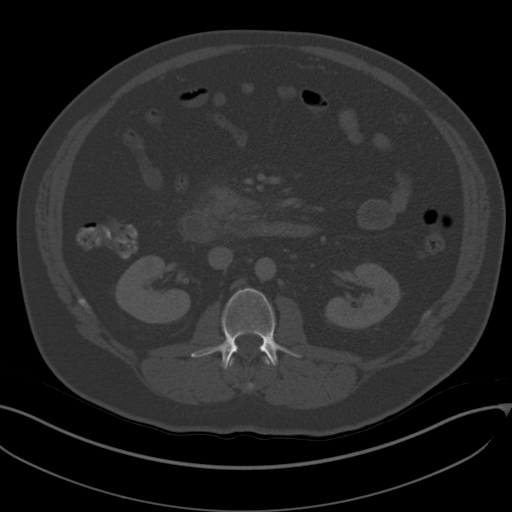
[im 67/97  soft-tissue]
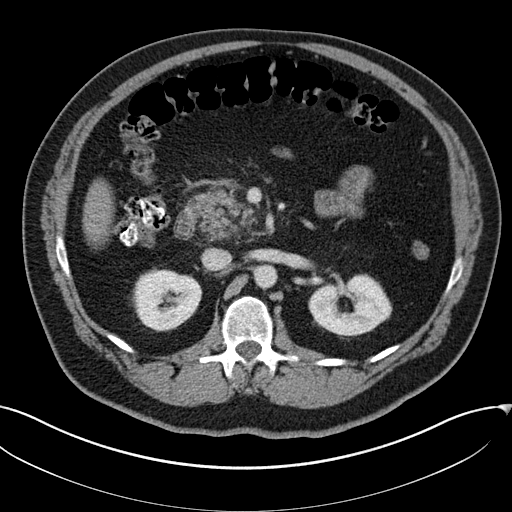
[im 79/97  soft-tissue]
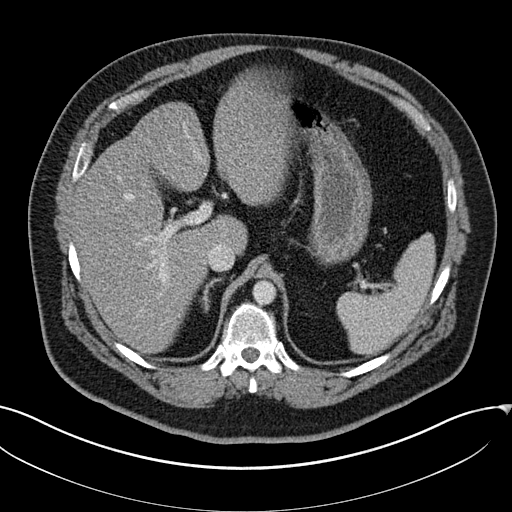
[im 85/97  soft-tissue]
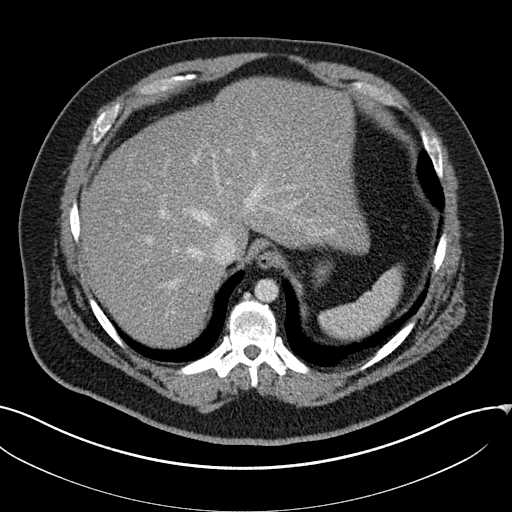
[im 91/97  soft-tissue]
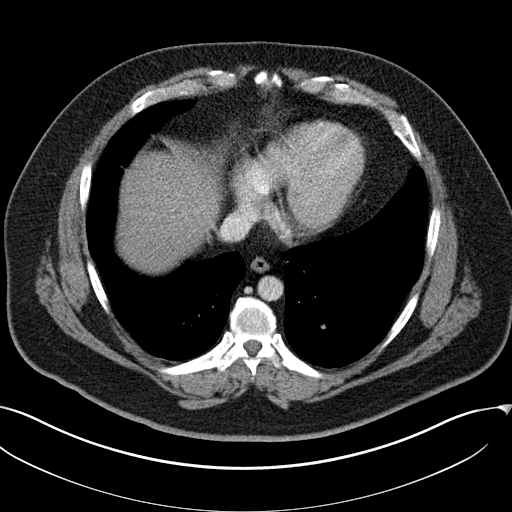

[Series 5: coronal st · coronal · 0.80mm/px · 3 of 161 slices shown]
[im 54/161  soft-tissue]
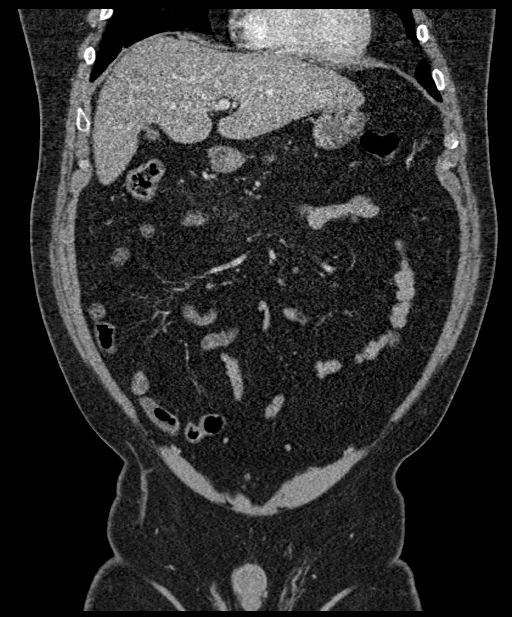
[im 72/161  soft-tissue]
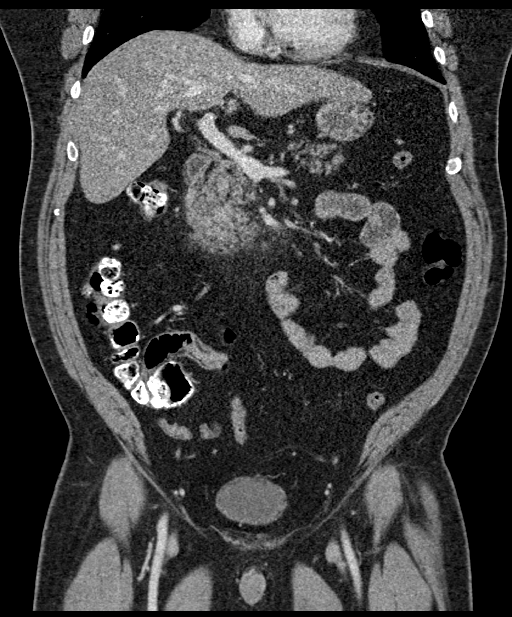
[im 89/161  soft-tissue]
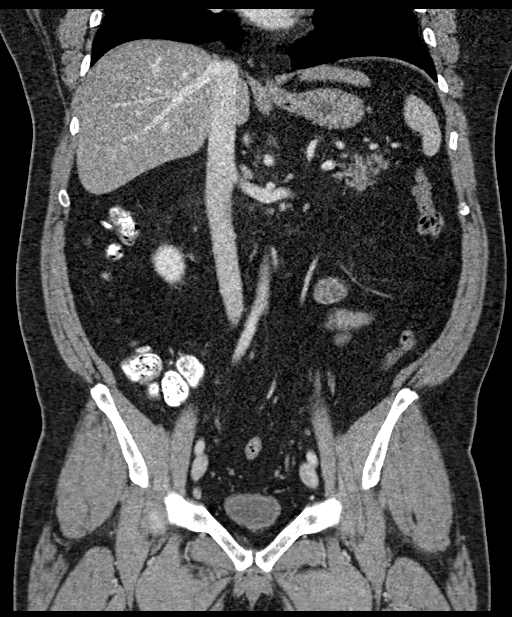

[16 of 46 positions shown; findings below may reference images not displayed]

FINDINGS: Lower chest: No acute abnormality.

Hepatobiliary: There is diffuse fatty infiltration of the liver
parenchyma. No focal liver abnormality is seen. The gallbladder is
markedly contracted in appearance. No gallstones, gallbladder wall
thickening, or biliary dilatation.

Pancreas: Mild inflammatory fat stranding is seen surrounding the
head of the pancreas. There is no evidence of pancreatic ductal
dilatation

Spleen: Normal in size without focal abnormality.

Adrenals/Urinary Tract: Adrenal glands are unremarkable. Kidneys are
normal in size, without renal calculi or hydronephrosis. A 1.1 cm
diameter cyst is seen within the anterior aspect of the mid right
kidney. Bladder is unremarkable.

Stomach/Bowel: Stomach is within normal limits. The appendix is not
clearly identified. No evidence of bowel wall thickening,
distention, or inflammatory changes.

Vascular/Lymphatic: No significant vascular findings are present. No
enlarged abdominal or pelvic lymph nodes.

Reproductive: Prostate is unremarkable.

Other: No abdominal wall hernia or abnormality. No abdominopelvic
ascites.

Musculoskeletal: No acute or significant osseous findings.
IMPRESSION: 1. Mild acute pancreatitis. Correlation with pancreatic enzymes is
recommended.
2. Mild inflammation of the adjacent portion of the proximal
duodenum can not be excluded.
